# Patient Record
Sex: Female | Born: 1980 | Race: White | Hispanic: No | Marital: Single | Smoking: Current every day smoker
Health system: Southern US, Community
[De-identification: ages and names within clinical notes are randomized; demographics above are authoritative.]

## PROBLEM LIST (undated history)

## (undated) DIAGNOSIS — Z9889 Other specified postprocedural states: Secondary | ICD-10-CM

## (undated) DIAGNOSIS — F17200 Nicotine dependence, unspecified, uncomplicated: Secondary | ICD-10-CM

## (undated) DIAGNOSIS — F99 Mental disorder, not otherwise specified: Secondary | ICD-10-CM

## (undated) DIAGNOSIS — N760 Acute vaginitis: Secondary | ICD-10-CM

## (undated) DIAGNOSIS — Z8619 Personal history of other infectious and parasitic diseases: Secondary | ICD-10-CM

## (undated) DIAGNOSIS — R87619 Unspecified abnormal cytological findings in specimens from cervix uteri: Secondary | ICD-10-CM

## (undated) DIAGNOSIS — A630 Anogenital (venereal) warts: Secondary | ICD-10-CM

## (undated) DIAGNOSIS — T1490XA Injury, unspecified, initial encounter: Secondary | ICD-10-CM

## (undated) DIAGNOSIS — IMO0002 Reserved for concepts with insufficient information to code with codable children: Secondary | ICD-10-CM

## (undated) DIAGNOSIS — Z789 Other specified health status: Secondary | ICD-10-CM

## (undated) HISTORY — DX: Acute vaginitis: N76.0

## (undated) HISTORY — PX: COLPOSCOPY: SHX161

## (undated) HISTORY — DX: Nicotine dependence, unspecified, uncomplicated: F17.200

## (undated) HISTORY — PX: TUBAL LIGATION: SHX77

## (undated) HISTORY — DX: Other specified postprocedural states: Z98.890

## (undated) HISTORY — DX: Reserved for concepts with insufficient information to code with codable children: IMO0002

## (undated) HISTORY — DX: Mental disorder, not otherwise specified: F99

## (undated) HISTORY — DX: Personal history of other infectious and parasitic diseases: Z86.19

## (undated) HISTORY — DX: Injury, unspecified, initial encounter: T14.90XA

## (undated) HISTORY — PX: WISDOM TOOTH EXTRACTION: SHX21

## (undated) HISTORY — DX: Unspecified abnormal cytological findings in specimens from cervix uteri: R87.619

## (undated) HISTORY — DX: Anogenital (venereal) warts: A63.0

---
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
## 2002-06-24 ENCOUNTER — Emergency Department (HOSPITAL_COMMUNITY): Admission: EM | Admit: 2002-06-24 | Discharge: 2002-06-24 | Payer: Self-pay | Admitting: Emergency Medicine

## 2002-10-11 ENCOUNTER — Other Ambulatory Visit: Admission: RE | Admit: 2002-10-11 | Discharge: 2002-10-11 | Payer: Self-pay | Admitting: Obstetrics and Gynecology

## 2004-03-15 ENCOUNTER — Other Ambulatory Visit: Admission: RE | Admit: 2004-03-15 | Discharge: 2004-03-15 | Payer: Self-pay | Admitting: Obstetrics and Gynecology

## 2004-04-01 DIAGNOSIS — T1490XA Injury, unspecified, initial encounter: Secondary | ICD-10-CM

## 2004-04-01 HISTORY — DX: Injury, unspecified, initial encounter: T14.90XA

## 2004-05-18 ENCOUNTER — Other Ambulatory Visit: Admission: RE | Admit: 2004-05-18 | Discharge: 2004-05-18 | Payer: Self-pay | Admitting: Obstetrics and Gynecology

## 2004-10-16 ENCOUNTER — Other Ambulatory Visit: Admission: RE | Admit: 2004-10-16 | Discharge: 2004-10-16 | Payer: Self-pay | Admitting: Obstetrics and Gynecology

## 2005-03-15 ENCOUNTER — Other Ambulatory Visit: Admission: RE | Admit: 2005-03-15 | Discharge: 2005-03-15 | Payer: Self-pay | Admitting: Obstetrics and Gynecology

## 2005-04-01 HISTORY — PX: SPLENECTOMY, PARTIAL: SHX787

## 2005-08-09 ENCOUNTER — Inpatient Hospital Stay (HOSPITAL_COMMUNITY): Admission: EM | Admit: 2005-08-09 | Discharge: 2005-08-14 | Payer: Self-pay | Admitting: Emergency Medicine

## 2005-08-25 ENCOUNTER — Emergency Department (HOSPITAL_COMMUNITY): Admission: EM | Admit: 2005-08-25 | Discharge: 2005-08-25 | Payer: Self-pay | Admitting: Emergency Medicine

## 2005-10-27 ENCOUNTER — Emergency Department (HOSPITAL_COMMUNITY): Admission: EM | Admit: 2005-10-27 | Discharge: 2005-10-27 | Payer: Self-pay | Admitting: Emergency Medicine

## 2005-10-29 ENCOUNTER — Emergency Department (HOSPITAL_COMMUNITY): Admission: EM | Admit: 2005-10-29 | Discharge: 2005-10-29 | Payer: Self-pay | Admitting: Emergency Medicine

## 2006-10-09 ENCOUNTER — Emergency Department (HOSPITAL_COMMUNITY): Admission: EM | Admit: 2006-10-09 | Discharge: 2006-10-09 | Payer: Self-pay | Admitting: Emergency Medicine

## 2008-07-10 ENCOUNTER — Inpatient Hospital Stay (HOSPITAL_COMMUNITY): Admission: AD | Admit: 2008-07-10 | Discharge: 2008-07-12 | Payer: Self-pay | Admitting: Obstetrics and Gynecology

## 2010-03-31 ENCOUNTER — Inpatient Hospital Stay (HOSPITAL_COMMUNITY): Admission: AD | Admit: 2010-03-31 | Payer: Self-pay | Admitting: Obstetrics and Gynecology

## 2010-06-11 ENCOUNTER — Inpatient Hospital Stay (HOSPITAL_COMMUNITY)
Admission: AD | Admit: 2010-06-11 | Discharge: 2010-06-13 | DRG: 774 | Disposition: A | Discharge status: Home / Self Care | Payer: Medicaid Other | Source: Ambulatory Visit | Attending: Obstetrics and Gynecology | Admitting: Obstetrics and Gynecology

## 2010-06-11 ENCOUNTER — Other Ambulatory Visit: Payer: Self-pay | Admitting: Obstetrics and Gynecology

## 2010-06-11 DIAGNOSIS — O9903 Anemia complicating the puerperium: Secondary | ICD-10-CM | POA: Diagnosis not present

## 2010-06-11 DIAGNOSIS — D649 Anemia, unspecified: Secondary | ICD-10-CM | POA: Diagnosis not present

## 2010-06-11 DIAGNOSIS — O4590 Premature separation of placenta, unspecified, unspecified trimester: Principal | ICD-10-CM | POA: Diagnosis present

## 2010-06-11 LAB — CBC
MCHC: 33.2 g/dL (ref 30.0–36.0)
MCV: 92 fL (ref 78.0–100.0)
RBC: 3.63 MIL/uL — ABNORMAL LOW (ref 3.87–5.11)
RDW: 14.4 % (ref 11.5–15.5)
WBC: 18.4 10*3/uL — ABNORMAL HIGH (ref 4.0–10.5)

## 2010-06-12 LAB — CBC
Hemoglobin: 9.1 g/dL — ABNORMAL LOW (ref 12.0–15.0)
MCHC: 33.3 g/dL (ref 30.0–36.0)
RDW: 14.4 % (ref 11.5–15.5)

## 2010-06-13 LAB — CBC
HCT: 27.7 % — ABNORMAL LOW (ref 36.0–46.0)
Hemoglobin: 8.9 g/dL — ABNORMAL LOW (ref 12.0–15.0)
MCHC: 32.1 g/dL (ref 30.0–36.0)
MCV: 93.9 fL (ref 78.0–100.0)
RDW: 14.7 % (ref 11.5–15.5)
WBC: 13.2 10*3/uL — ABNORMAL HIGH (ref 4.0–10.5)

## 2010-06-25 ENCOUNTER — Inpatient Hospital Stay (HOSPITAL_COMMUNITY): Admission: RE | Admit: 2010-06-25 | Payer: Self-pay | Source: Ambulatory Visit | Admitting: Obstetrics and Gynecology

## 2010-06-26 ENCOUNTER — Other Ambulatory Visit: Payer: Self-pay | Admitting: Obstetrics and Gynecology

## 2010-06-26 ENCOUNTER — Ambulatory Visit (HOSPITAL_COMMUNITY)
Admission: RE | Admit: 2010-06-26 | Discharge: 2010-06-26 | Disposition: A | Discharge status: Home / Self Care | Payer: Medicaid Other | Source: Ambulatory Visit | Attending: Obstetrics and Gynecology | Admitting: Obstetrics and Gynecology

## 2010-06-26 ENCOUNTER — Ambulatory Visit (HOSPITAL_COMMUNITY): Payer: Medicaid Other

## 2010-06-26 DIAGNOSIS — IMO0002 Reserved for concepts with insufficient information to code with codable children: Secondary | ICD-10-CM

## 2010-06-26 LAB — CBC
HCT: 36.1 % (ref 36.0–46.0)
Hemoglobin: 11.7 g/dL — ABNORMAL LOW (ref 12.0–15.0)
MCV: 92.1 fL (ref 78.0–100.0)
RBC: 3.92 MIL/uL (ref 3.87–5.11)
RDW: 14 % (ref 11.5–15.5)

## 2010-07-11 LAB — CBC
HCT: 31.5 % — ABNORMAL LOW (ref 36.0–46.0)
HCT: 36.3 % (ref 36.0–46.0)
Hemoglobin: 10.5 g/dL — ABNORMAL LOW (ref 12.0–15.0)
Hemoglobin: 12.2 g/dL (ref 12.0–15.0)
MCHC: 33.3 g/dL (ref 30.0–36.0)
MCV: 94.3 fL (ref 78.0–100.0)
RBC: 3.34 MIL/uL — ABNORMAL LOW (ref 3.87–5.11)
RBC: 3.88 MIL/uL (ref 3.87–5.11)
RDW: 14.6 % (ref 11.5–15.5)
WBC: 17.5 10*3/uL — ABNORMAL HIGH (ref 4.0–10.5)
WBC: 18.8 10*3/uL — ABNORMAL HIGH (ref 4.0–10.5)

## 2010-07-11 LAB — CCBB MATERNAL DONOR DRAW

## 2010-07-17 NOTE — Op Note (Signed)
  NAME:  [name], [name]                 ACCOUNT NO.:  [id number]  MEDICAL RECORD NO.:  [id number]           PATIENT TYPE:  O  LOCATION:  [facility]                          [facility]  PHYSICIAN:  Kaito Schulenburg A. Nimrat Woolworth, M.D. DATE OF BIRTH:  26-Feb-1981  DATE OF PROCEDURE: DATE OF DISCHARGE:                              OPERATIVE REPORT   PREOPERATIVE DIAGNOSIS:  Retained products of conception, 2 weeks status post vaginal delivery.  POSTOPERATIVE DIAGNOSIS:  Retained products of conception, 2 weeks status post vaginal delivery.  PROCEDURE:  D and C.  SURGEON:  Durward Matranga A. Araceli Coufal, MD  There are no assistants.  ANESTHESIA:  General and local.  FINDINGS:  A 12-week size uterus for blood.  SPECIMENS:  Products of conception sent to Pathology.  ESTIMATED BLOOD LOSS:  About 200 mL.  COMPLICATIONS:  None.  The patient went to PACU in stable condition.  PROCEDURE IN DETAIL:  The patient was taken to the operating room where she was given anesthesia, placed in dorsal lithotomy position, prepped and draped in normal sterile fashion.  Uterus was noted to be 12-week size and no adnexal masses.  The weighted speculum was placed into the posterior portion of the vagina.  A Deaver was placed into the anterior portion of the vagina.  The anterior cervix was grasped with single- tooth tenaculum, 18 mL of 2% lidocaine was used for cervical block. Under ultrasound guidance, a size 10 suction curettage placed into the cavity.  There were no perforations and this large amount of blood were obtained through the suction.  There may have been some products and then a curette was placed into the uterus and all four walls were curetted without difficulty.  Suction curettage was then replaced and the remaining amount of blood was removed.  All instruments were removed from the vagina.  Tenaculum site was noted to be hemostatic. Sponge, lap, and needle counts were correct.  The ultrasonographer took a  before and after picture and there were no signs of retained products at the end of the procedure.     Deen Deguia A. Normand Sloop, M.D.     NAD/MEDQ  D:  06/26/2010  T:  06/27/2010  Job:  [id number]  Electronically Signed by Jaymes Graff M.D. on 07/17/2010 12:49:11 PM
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
## 2010-08-17 NOTE — H&P (Signed)
[facility]  PatientANNIEBELL, [name] Visit Number: [id number] MRN: [id number]          Service Type: OBS Location: 910B [id number] Attending Physician:  Jaymes Graff A Dictated by:   Pierre Bali Normand Sloop, M.D. Adm. Date:  11/23/2000   CC:         Mack Guise, C.N.M.   History and Physical  HISTORY OF PRESENT ILLNESS:   Patient is an 30 year old G2 P0-0-1-0 with estimated due date of December 01, 2000 at 27 and six-sevenths weeks today who presented to the MAU complaining of contractions increasing in intensity and frequency.  Good fetal movement, no leakage of fluid.  She denied any headache or blurred vision or right upper quadrant pain.  Denied any prodromal or HSV outbreak now or any time in this pregnancy.  Prenatal care is at [facility] with nurse midwife.  Complications are: 1. Questionable history of HSV.  Patient reports that she had cold sores but    the blood showed that she may have HSV 1 or 2. 2. Teen pregnancy. 3. Patient is a smoker. 4. Late prenatal care.  PRENATAL LABORATORY DATA:     O positive, antibody negative.  RPR was nonreactive.  Rubella immune.  Hepatitis B surface antigen negative.  HIV was nonreactive.  GC and chlamydia cultures both negative.  Pap did show some yeast.  One-hour Glucola was 82.  Patient was GBS negative.  PAST OBSTETRICAL HISTORY:     Significant for elective abortion in October 2001 in the first trimester.  PAST GYNECOLOGICAL HISTORY:    Menarche at age 49, occurring every 83 to 30 days, lasting for five to seven days.  A questionable history of HSV but no history of any outbreaks or abnormal Paps.  PAST MEDICAL AND SURGICAL HISTORY:             Unremarkable.  MEDICATIONS:                  Prenatal vitamins.  ALLERGIES:                    Patient has no known drug allergies.  SOCIAL HISTORY:               She is a smoker, a half a pack to a pack a day.  PHYSICAL EXAMINATION:  FETAL MONITOR  DATA:           Fetal heart tones are 135-145, reactive, with good long-term variability.  Toco is contracting every two to six minutes.  GENERAL:                      Patient is in mild distress secondary to pain.  HEART:                        Regular.  LUNGS:                        Clear.  ABDOMEN:                      Gravid, soft, nontender.  PELVIC:                       On sterile speculum exam there were HSV lesions seen on cervix or in the vagina or on vulva.  Cervix was 5, 100, 0.  EXTREMITIES:  With 2+ deep tendon reflexes bilaterally with trace edema bilaterally and no clonus bilaterally.  IMPRESSION:                   Patient is pregnant at term.  PLAN:                         She is admitted to labor and delivery, given Stadol and Phenergan for pain.  Patient does not want an epidural at this point.  PIH labs were sent and monitor blood pressures.  Anticipate vaginal delivery.  CNM will be coming in for delivery. Dictated by:   Pierre Bali. Normand Sloop, M.D. Attending Physician:  Michael Litter DD:  11/23/00 TD:  11/23/00 Job: [id number]
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
## 2010-11-28 ENCOUNTER — Other Ambulatory Visit: Payer: Self-pay | Admitting: Obstetrics and Gynecology

## 2010-12-06 ENCOUNTER — Encounter (HOSPITAL_COMMUNITY): Payer: Self-pay | Admitting: *Deleted

## 2010-12-18 ENCOUNTER — Encounter (HOSPITAL_COMMUNITY): Payer: Self-pay | Admitting: Anesthesiology

## 2010-12-18 ENCOUNTER — Encounter (HOSPITAL_COMMUNITY): Payer: Self-pay | Admitting: *Deleted

## 2010-12-18 ENCOUNTER — Ambulatory Visit (HOSPITAL_COMMUNITY)
Admission: RE | Admit: 2010-12-18 | Discharge: 2010-12-18 | Disposition: A | Discharge status: Home / Self Care | Payer: Medicaid Other | Source: Ambulatory Visit | Attending: Obstetrics and Gynecology | Admitting: Obstetrics and Gynecology

## 2010-12-18 ENCOUNTER — Encounter (HOSPITAL_COMMUNITY)
Admission: RE | Disposition: A | Discharge status: Home / Self Care | Payer: Self-pay | Source: Ambulatory Visit | Attending: Obstetrics and Gynecology

## 2010-12-18 ENCOUNTER — Ambulatory Visit (HOSPITAL_COMMUNITY): Payer: Medicaid Other | Admitting: Anesthesiology

## 2010-12-18 DIAGNOSIS — Z9889 Other specified postprocedural states: Secondary | ICD-10-CM

## 2010-12-18 DIAGNOSIS — Z302 Encounter for sterilization: Secondary | ICD-10-CM | POA: Insufficient documentation

## 2010-12-18 HISTORY — PX: LAPAROSCOPIC TUBAL LIGATION: SHX1937

## 2010-12-18 HISTORY — DX: Other specified health status: Z78.9

## 2010-12-18 LAB — SURGICAL PCR SCREEN: Staphylococcus aureus: NEGATIVE

## 2010-12-18 LAB — CBC
HCT: 40.2 % (ref 36.0–46.0)
Hemoglobin: 13.4 g/dL (ref 12.0–15.0)
MCHC: 33.3 g/dL (ref 30.0–36.0)
MCV: 89.5 fL (ref 78.0–100.0)
RDW: 17.2 % — ABNORMAL HIGH (ref 11.5–15.5)

## 2010-12-18 SURGERY — LIGATION, FALLOPIAN TUBE, LAPAROSCOPIC
Anesthesia: General | Site: Abdomen | Laterality: Bilateral | Wound class: Clean

## 2010-12-18 MED ORDER — BUPIVACAINE HCL (PF) 0.25 % IJ SOLN
INTRAMUSCULAR | Status: DC | PRN
  Administered 2010-12-18: 15 mL

## 2010-12-18 MED ORDER — LIDOCAINE HCL (CARDIAC) 20 MG/ML IV SOLN
INTRAVENOUS | Status: AC
  Filled 2010-12-18: qty 5

## 2010-12-18 MED ORDER — FENTANYL CITRATE 0.05 MG/ML IJ SOLN
INTRAMUSCULAR | Status: AC
  Filled 2010-12-18: qty 5

## 2010-12-18 MED ORDER — MEPERIDINE HCL 25 MG/ML IJ SOLN: 6.2500 mg | INTRAMUSCULAR | Status: DC | PRN

## 2010-12-18 MED ORDER — DEXAMETHASONE SODIUM PHOSPHATE 10 MG/ML IJ SOLN
INTRAMUSCULAR | Status: AC
  Filled 2010-12-18: qty 1

## 2010-12-18 MED ORDER — MIDAZOLAM HCL 5 MG/5ML IJ SOLN
INTRAMUSCULAR | Status: DC | PRN
  Administered 2010-12-18: 2 mg via INTRAVENOUS

## 2010-12-18 MED ORDER — PROPOFOL 10 MG/ML IV EMUL
INTRAVENOUS | Status: DC | PRN
  Administered 2010-12-18: 200 mg via INTRAVENOUS

## 2010-12-18 MED ORDER — GLYCOPYRROLATE 0.2 MG/ML IJ SOLN
INTRAMUSCULAR | Status: DC | PRN
  Administered 2010-12-18: 0.2 mg via INTRAVENOUS

## 2010-12-18 MED ORDER — SILVER NITRATE-POT NITRATE 75-25 % EX MISC
CUTANEOUS | Status: DC | PRN
  Administered 2010-12-18: 1

## 2010-12-18 MED ORDER — MUPIROCIN 2 % EX OINT
TOPICAL_OINTMENT | CUTANEOUS | Status: AC
  Filled 2010-12-18: qty 22

## 2010-12-18 MED ORDER — SUCCINYLCHOLINE CHLORIDE 20 MG/ML IJ SOLN
INTRAMUSCULAR | Status: DC | PRN
  Administered 2010-12-18: 60 mg via INTRAVENOUS

## 2010-12-18 MED ORDER — ROCURONIUM BROMIDE 100 MG/10ML IV SOLN
INTRAVENOUS | Status: DC | PRN
  Administered 2010-12-18: 10 mg via INTRAVENOUS

## 2010-12-18 MED ORDER — PROPOFOL 10 MG/ML IV EMUL
INTRAVENOUS | Status: AC
  Filled 2010-12-18: qty 20

## 2010-12-18 MED ORDER — HYDROCODONE-ACETAMINOPHEN 5-325 MG PO TABS
ORAL_TABLET | ORAL | Status: AC
  Filled 2010-12-18: qty 1

## 2010-12-18 MED ORDER — LIDOCAINE HCL (CARDIAC) 20 MG/ML IV SOLN
INTRAVENOUS | Status: DC | PRN
  Administered 2010-12-18: 100 mg via INTRAVENOUS

## 2010-12-18 MED ORDER — ONDANSETRON HCL 4 MG/2ML IJ SOLN
INTRAMUSCULAR | Status: AC
  Filled 2010-12-18: qty 2

## 2010-12-18 MED ORDER — KETOROLAC TROMETHAMINE 30 MG/ML IJ SOLN
INTRAMUSCULAR | Status: AC
  Administered 2010-12-18: 30 mg via INTRAVENOUS
  Filled 2010-12-18: qty 1

## 2010-12-18 MED ORDER — LACTATED RINGERS IV SOLN
INTRAVENOUS | Status: DC
  Administered 2010-12-18: 10:00:00 via INTRAVENOUS

## 2010-12-18 MED ORDER — HYDROCODONE-ACETAMINOPHEN 5-325 MG PO TABS
1.0000 | ORAL_TABLET | Freq: Once | ORAL | Status: AC
  Administered 2010-12-18: 1 via ORAL

## 2010-12-18 MED ORDER — HYDROMORPHONE HCL 1 MG/ML IJ SOLN
INTRAMUSCULAR | Status: AC
  Filled 2010-12-18: qty 1

## 2010-12-18 MED ORDER — FENTANYL CITRATE 0.05 MG/ML IJ SOLN
INTRAMUSCULAR | Status: AC
  Administered 2010-12-18: 50 ug via INTRAVENOUS
  Filled 2010-12-18: qty 2

## 2010-12-18 MED ORDER — NEOSTIGMINE METHYLSULFATE 1 MG/ML IJ SOLN
INTRAMUSCULAR | Status: AC
  Filled 2010-12-18: qty 10

## 2010-12-18 MED ORDER — MUPIROCIN 2 % EX OINT
TOPICAL_OINTMENT | Freq: Two times a day (BID) | CUTANEOUS | Status: DC
  Administered 2010-12-18: 10:00:00 via NASAL

## 2010-12-18 MED ORDER — GLYCOPYRROLATE 0.2 MG/ML IJ SOLN
INTRAMUSCULAR | Status: AC
  Filled 2010-12-18: qty 2

## 2010-12-18 MED ORDER — ROCURONIUM BROMIDE 50 MG/5ML IV SOLN
INTRAVENOUS | Status: AC
  Filled 2010-12-18: qty 1

## 2010-12-18 MED ORDER — ONDANSETRON HCL 4 MG/2ML IJ SOLN
INTRAMUSCULAR | Status: DC | PRN
  Administered 2010-12-18: 4 mg via INTRAVENOUS

## 2010-12-18 MED ORDER — ONDANSETRON HCL 4 MG/2ML IJ SOLN
4.0000 mg | Freq: Once | INTRAMUSCULAR | Status: DC | PRN
Start: 2010-12-18 — End: 2010-12-18

## 2010-12-18 MED ORDER — SUCCINYLCHOLINE CHLORIDE 20 MG/ML IJ SOLN
INTRAMUSCULAR | Status: AC
  Filled 2010-12-18: qty 1

## 2010-12-18 MED ORDER — FENTANYL CITRATE 0.05 MG/ML IJ SOLN
INTRAMUSCULAR | Status: AC
  Filled 2010-12-18: qty 2

## 2010-12-18 MED ORDER — HYDROMORPHONE HCL 1 MG/ML IJ SOLN
INTRAMUSCULAR | Status: DC | PRN
  Administered 2010-12-18: 1 mg via INTRAVENOUS

## 2010-12-18 MED ORDER — IBUPROFEN 200 MG PO TABS: 200.0000 mg | ORAL_TABLET | Freq: Four times a day (QID) | ORAL | Status: AC | PRN

## 2010-12-18 MED ORDER — KETOROLAC TROMETHAMINE 30 MG/ML IJ SOLN: 30.0000 mg | Freq: Four times a day (QID) | INTRAMUSCULAR | Status: DC | PRN

## 2010-12-18 MED ORDER — HYDROCODONE-ACETAMINOPHEN 5-500 MG PO TABS: 1.0000 | ORAL_TABLET | Freq: Four times a day (QID) | ORAL | Status: AC | PRN

## 2010-12-18 MED ORDER — KETOROLAC TROMETHAMINE 30 MG/ML IJ SOLN
INTRAMUSCULAR | Status: AC
  Administered 2010-12-18: 30 mg via INTRAMUSCULAR
  Filled 2010-12-18: qty 1

## 2010-12-18 MED ORDER — KETOROLAC TROMETHAMINE 30 MG/ML IJ SOLN
15.0000 mg | Freq: Once | INTRAMUSCULAR | Status: AC | PRN
  Administered 2010-12-18: 30 mg via INTRAVENOUS

## 2010-12-18 MED ORDER — FENTANYL CITRATE 0.05 MG/ML IJ SOLN
INTRAMUSCULAR | Status: DC | PRN
  Administered 2010-12-18: 100 ug via INTRAVENOUS
  Administered 2010-12-18: 150 ug via INTRAVENOUS

## 2010-12-18 MED ORDER — NEOSTIGMINE METHYLSULFATE 1 MG/ML IJ SOLN
INTRAMUSCULAR | Status: DC | PRN
  Administered 2010-12-18: 1 mg via INTRAMUSCULAR

## 2010-12-18 MED ORDER — CEFAZOLIN SODIUM 1-5 GM-% IV SOLN
INTRAVENOUS | Status: AC
  Administered 2010-12-18: 1 g via INTRAVENOUS
  Filled 2010-12-18: qty 50

## 2010-12-18 MED ORDER — DEXAMETHASONE SODIUM PHOSPHATE 10 MG/ML IJ SOLN
INTRAMUSCULAR | Status: DC | PRN
  Administered 2010-12-18: 10 mg via INTRAVENOUS

## 2010-12-18 MED ORDER — CEFAZOLIN SODIUM 1-5 GM-% IV SOLN: 1.0000 g | INTRAVENOUS | Status: DC

## 2010-12-18 MED ORDER — MIDAZOLAM HCL 2 MG/2ML IJ SOLN
INTRAMUSCULAR | Status: AC
  Filled 2010-12-18: qty 2

## 2010-12-18 MED ORDER — GLYCOPYRROLATE 0.2 MG/ML IJ SOLN
INTRAMUSCULAR | Status: AC
  Filled 2010-12-18: qty 1

## 2010-12-18 MED ORDER — FENTANYL CITRATE 0.05 MG/ML IJ SOLN
25.0000 ug | INTRAMUSCULAR | Status: DC | PRN
  Administered 2010-12-18 (×3): 50 ug via INTRAVENOUS

## 2010-12-18 SURGICAL SUPPLY — 15 items
CATH ROBINSON RED A/P 16FR (CATHETERS) ×2 IMPLANT
CLIP FILSHIE TUBAL LIGA STRL (Clip) ×2 IMPLANT
CLOTH BEACON ORANGE TIMEOUT ST (SAFETY) ×2 IMPLANT
DRSG COVADERM PLUS 2X2 (GAUZE/BANDAGES/DRESSINGS) ×1 IMPLANT
GLOVE BIO SURGEON STRL SZ 6.5 (GLOVE) ×4 IMPLANT
GLOVE BIOGEL PI IND STRL 7.0 (GLOVE) ×1 IMPLANT
GLOVE BIOGEL PI INDICATOR 7.0 (GLOVE) ×1
GOWN PREVENTION PLUS LG XLONG (DISPOSABLE) ×4 IMPLANT
PACK LAPAROSCOPY BASIN (CUSTOM PROCEDURE TRAY) ×2 IMPLANT
SUT MNCRL AB 3-0 PS2 27 (SUTURE) ×2 IMPLANT
SUT VICRYL 0 UR6 27IN ABS (SUTURE) ×2 IMPLANT
TOWEL OR 17X24 6PK STRL BLUE (TOWEL DISPOSABLE) ×4 IMPLANT
TROCAR BALLN 12MMX100 BLUNT (TROCAR) ×2 IMPLANT
TROCAR Z-THREAD FIOS 5X100MM (TROCAR) ×1 IMPLANT
WATER STERILE IRR 1000ML POUR (IV SOLUTION) ×2 IMPLANT

## 2010-12-18 NOTE — H&P (Signed)
NAME:  [name], [name]                 ACCOUNT NO.:  [id number]  MEDICAL RECORD NO.:  [id number]  LOCATION:  [facility]                          [facility]  PHYSICIAN:  Jadon Harbaugh A. Brittiany Wiehe, M.D. DATE OF BIRTH:  05-Apr-1980  DATE OF ADMISSION:  12/18/2010 DATE OF DISCHARGE:                             HISTORY & PHYSICAL   HISTORY OF PRESENT ILLNESS:  The patient is a 30 year old, gravida 4, para 3 who presented to my office and she wanted a tubal ligation.  She has tried NuvaRing and other birth control in the past and wants permanent sterilization.  PAST MEDICAL HISTORY:  Unremarkable.  PAST SURGICAL HISTORY:  Spleen repaired, mouth implants, and a D and C.  MEDICATIONS:  None.  SOCIAL HISTORY:  She smokes 2 packs a day and has occasional alcohol use.  No illicit drug use.  FAMILY HISTORY:  No history of breast or ovarian cancer.  REVIEW OF SYSTEMS:  HEMATOLOGIC:  No bleeding disorders. MUSCULOSKELETAL:  No weakness.  GENITOURINARY:  Significant for history of CIN-1.  Her last Pap smear was normal.  CARDIOVASCULAR:  No heart palpitations.  ALLERGIES:  The patient has no known drug allergies.  PHYSICAL EXAMINATION:  VITAL SIGNS:  The patient's weight is 149 pounds, her blood pressure is 100/70, and she is 5 feet 5 inches. HEENT:  Her pupils are equal.  Her hearing is normal.  Throat is clear. NECK:  Thyroid is not enlarged. HEART:  Regular rate and rhythm. LUNGS:  Clear to auscultation bilaterally. BACK:  No CVA tenderness bilaterally. ABDOMEN:  Nontender without any masses or organomegaly. EXTREMITIES;  She has no cyanosis, clubbing, or edema. NEUROLOGIC:  Within normal limits. GU:  Vulva is normal.  Vagina is normal.  Cervix is slightly friable without any lesions.  Uterus is normal shape, size, and consistency. Adnexa has no masses.  Rectovaginal, no masses.  ASSESSMENT:  Multiparity, desires sterilization.  PLAN:  Laparoscopic tubal ligation.  The patient understands the  risks are not limited to bleeding, infection, damage to internal organs such as bowel, bladder, major blood vessels, and there is a failure rate of about 1 in 200 and half of those still get pregnant.  If they get pregnant, they get pregnant in their tubes.  So, if she gets pregnant, she needs to notify asap.  The patient also reviewed all birth control options.     Habib Kise A. Normand Sloop, M.D.     NAD/MEDQ  D:  12/18/2010  T:  12/18/2010  Job:  [id number]

## 2010-12-18 NOTE — Transfer of Care (Signed)
Immediate Anesthesia Transfer of Care Note  Patient: [name]  Procedure(s) Performed:  LAPAROSCOPIC TUBAL LIGATION - with clips  Patient Location: PACU  Anesthesia Type: General  Level of Consciousness: awake, alert  and oriented  Airway & Oxygen Therapy: Patient Spontanous Breathing and Patient connected to nasal cannula oxygen  Post-op Assessment: Report given to PACU RN and Post -op Vital signs reviewed and stable  Post vital signs: Reviewed and stable  Complications: No apparent anesthesia complications

## 2010-12-18 NOTE — Anesthesia Postprocedure Evaluation (Signed)
Anesthesia Post Note  Patient: [name]  Procedure(s) Performed:  LAPAROSCOPIC TUBAL LIGATION - with clips  Anesthesia type: General  Patient location: PACU  Post pain: Pain level controlled  Post assessment: Post-op Vital signs reviewed  Last Vitals:  Filed Vitals:   12/18/10 1215  BP: 125/79  Pulse: 51  Temp:   Resp: 16    Post vital signs: Reviewed  Level of consciousness: sedated  Complications: No apparent anesthesia complicationsfj

## 2010-12-18 NOTE — Op Note (Signed)
Diagnostic Laparoscopy Procedure Note  Indications: The patient is a 30 y.o. female with diagnosis of multiparity.  Pre-operative Diagnosis: Multiparity desires sterilization  Post-operative Diagnosis: same  Surgeon: AVWUJWJ,XBJYN A   Assistants: none  Anesthesia: General endotracheal anesthesia  ASA Class: 1  Procedure Details  The patient was seen in the Holding Room. The risks, benefits, complications, treatment options, and expected outcomes were discussed with the patient. The possibilities of reaction to medication, pulmonary aspiration, perforation of viscus, bleeding, recurrent infection, the need for additional procedures, failure to diagnose a condition, and creating a complication requiring transfusion or operation were discussed with the patient. The patient concurred with the proposed plan, giving informed consent. The patient was taken to the Operating Room, identified as [name] and the procedure verified as Diagnostic Laparoscopy with BTL. A Time Out was held and the above information confirmed.  After induction of general anesthesia, the patient was placed in modified dorsal lithotomy position where she was prepped, draped, and catheterized in the normal, sterile fashion.  The cervix was visualized and an intrauterine manipulator was placed. A 2 cm umbilical incision was then performed.and carried down to the fascia.  The fascia was then opened and extended bilaterally.  Peritoneum was then entered.  o vicryl was then placed around the fascia in a circumferential fashion.   The hasson was placed and ancored to the suture.The above findings were noted. Normal pelvic anatomy.  Uterus,tubes, ovaries and appendix apperared normal.  The bowel on the right side had some adhesions to the pelvic side wall.  The  Posterior culdesac and liver appeared normal.   Both fallopian tubes were identified and filshie clips were placed on the mid isthmic portion of the tubes.    Following  the procedure the umbilical hasson was removed after intra-abdominal carbon dioxide was expressed. The fascia was reaproximated by tying the 0 vicryl suture.   The incision was closed with subcutaneous and subcuticular sutures of 3-0 monocryl. The intrauterine manipulator was then removed.  There was bleeding from tenaculum site on the cervix.  It could not be relieved with pressure or silvernitrate.  A figure of 8 suture was placed and hemostasis was obtained.    Instrument, sponge, and needle counts were correct prior to abdominal closure and at the conclusion of the case.   Findings: Estimated Blood Loss:  Minimal         Drains: none         Total IV Fluids:         Specimens: none              Complications:  None; patient tolerated the procedure well.         Disposition: PACU - hemodynamically stable.         Condition: stable

## 2010-12-18 NOTE — Anesthesia Preprocedure Evaluation (Addendum)
Anesthesia Evaluation  Name, MR# and DOB Patient awake  General Assessment Comment  Reviewed: Allergy & Precautions, H&P , NPO status , Patient's Chart, lab work & pertinent test results  Airway Mallampati: I TM Distance: >3 FB     Dental  (+) Teeth Intact   Pulmonary    pulmonary exam normalPulmonary Exam Normal     Cardiovascular     Neuro/Psych   GI/Hepatic/Renal negative GI ROS  negative Liver ROS  negative Renal ROS        Endo/Other  Negative Endocrine ROS (+)      Abdominal Normal abdominal exam  (+)   Musculoskeletal negative musculoskeletal ROS (+)   Hematology negative hematology ROS (+)   Peds  Reproductive/Obstetrics negative OB ROS    Anesthesia Other Findings            Anesthesia Physical Anesthesia Plan  ASA: I  Anesthesia Plan: General   Post-op Pain Management:    Induction: Intravenous  Airway Management Planned: Oral ETT  Additional Equipment:   Intra-op Plan:   Post-operative Plan: Extubation in OR  Informed Consent: I have reviewed the patients History and Physical, chart, labs and discussed the procedure including the risks, benefits and alternatives for the proposed anesthesia with the patient or authorized representative who has indicated his/her understanding and acceptance.   Dental advisory given  Plan Discussed with:   Anesthesia Plan Comments:         Anesthesia Quick Evaluation

## 2010-12-18 NOTE — Anesthesia Procedure Notes (Signed)
Procedure Name: Intubation Date/Time: 12/18/2010 10:30 AM Performed by: Karleen Dolphin Pre-anesthesia Checklist: Patient identified, Emergency Drugs available, Suction available, Patient being monitored and Timeout performed Patient Re-evaluated:Patient Re-evaluated prior to inductionOxygen Delivery Method: Circle System Utilized Preoxygenation: Pre-oxygenation with 100% oxygen Intubation Type: IV induction Laryngoscope Size: Mac and 3 Grade View: Grade I Tube type: Oral Tube size: 7.0 mm Number of attempts: 1 Airway Equipment and Method: stylet Placement Confirmation: ETT inserted through vocal cords under direct vision,  positive ETCO2 and breath sounds checked- equal and bilateral Secured at: 21 cm Dental Injury: Teeth and Oropharynx as per pre-operative assessment

## 2010-12-19 ENCOUNTER — Encounter (HOSPITAL_COMMUNITY): Payer: Self-pay | Admitting: Obstetrics and Gynecology

## 2011-01-15 LAB — I-STAT 8, (EC8 V) (CONVERTED LAB)
Acid-Base Excess: 1
Operator id: 282201
Potassium: 4.2
Sodium: 142
TCO2: 28
pH, Ven: 7.377 — ABNORMAL HIGH

## 2011-08-12 ENCOUNTER — Telehealth: Payer: Self-pay | Admitting: Obstetrics and Gynecology

## 2011-08-12 NOTE — Telephone Encounter (Signed)
Spoke with pt rgd msg pt wants rx for yeast infection called into pharm advised pt need an appt for eval in order to get rx pt states on cycle advised pt can try monistat otc pt declined states she cant use tampons if she use that offered pt an appt for eval after cycle pt declined

## 2012-01-07 ENCOUNTER — Ambulatory Visit (INDEPENDENT_AMBULATORY_CARE_PROVIDER_SITE_OTHER): Payer: Medicaid Other

## 2012-01-07 VITALS — BP 110/72 | Ht 65.0 in | Wt 159.0 lb

## 2012-01-07 DIAGNOSIS — Z01419 Encounter for gynecological examination (general) (routine) without abnormal findings: Secondary | ICD-10-CM

## 2012-01-07 DIAGNOSIS — F17200 Nicotine dependence, unspecified, uncomplicated: Secondary | ICD-10-CM

## 2012-01-07 NOTE — Progress Notes (Signed)
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 Clarene Duke Change in ZOX:[id number]  Change in WJX:[id number]

## 2012-01-09 LAB — PAP IG, CT-NG, RFX HPV ASCU
Chlamydia Probe Amp: NEGATIVE
GC Probe Amp: NEGATIVE

## 2012-05-28 ENCOUNTER — Telehealth: Payer: Self-pay

## 2012-05-28 MED ORDER — FLUCONAZOLE 150 MG PO TABS: 150.0000 mg | ORAL_TABLET | Freq: Once | ORAL | Status: DC

## 2012-05-28 NOTE — Telephone Encounter (Signed)
Tc to pt per telephone call. Pt currently taking Azithromycin pres by another physician and states,"has a yeast infection". Pt states,"prescribing physician will not give her anti-fungal meds". Informed pt needs office eval;however pt opts to consult with provider. Consulted with VL, pt may have Diflucan x 1. If no improvement, pt needs office eval. Pt voices understanding. Rx for Diflucan e-pres to pharm on file.

## 2012-06-30 DIAGNOSIS — IMO0002 Reserved for concepts with insufficient information to code with codable children: Secondary | ICD-10-CM | POA: Insufficient documentation

## 2012-06-30 DIAGNOSIS — N760 Acute vaginitis: Secondary | ICD-10-CM | POA: Insufficient documentation

## 2012-06-30 DIAGNOSIS — F17200 Nicotine dependence, unspecified, uncomplicated: Secondary | ICD-10-CM

## 2012-06-30 HISTORY — DX: Nicotine dependence, unspecified, uncomplicated: F17.200
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
## 2012-12-06 ENCOUNTER — Emergency Department (HOSPITAL_BASED_OUTPATIENT_CLINIC_OR_DEPARTMENT_OTHER)
Admission: EM | Admit: 2012-12-06 | Discharge: 2012-12-06 | Disposition: A | Discharge status: Home / Self Care | Payer: Medicaid Other | Attending: Emergency Medicine | Admitting: Emergency Medicine

## 2012-12-06 ENCOUNTER — Encounter (HOSPITAL_BASED_OUTPATIENT_CLINIC_OR_DEPARTMENT_OTHER): Payer: Self-pay

## 2012-12-06 DIAGNOSIS — S1191XA Laceration without foreign body of unspecified part of neck, initial encounter: Secondary | ICD-10-CM

## 2012-12-06 DIAGNOSIS — X58XXXA Exposure to other specified factors, initial encounter: Secondary | ICD-10-CM | POA: Insufficient documentation

## 2012-12-06 DIAGNOSIS — Z23 Encounter for immunization: Secondary | ICD-10-CM | POA: Insufficient documentation

## 2012-12-06 DIAGNOSIS — Z87828 Personal history of other (healed) physical injury and trauma: Secondary | ICD-10-CM | POA: Insufficient documentation

## 2012-12-06 DIAGNOSIS — Z8781 Personal history of (healed) traumatic fracture: Secondary | ICD-10-CM | POA: Insufficient documentation

## 2012-12-06 DIAGNOSIS — F17200 Nicotine dependence, unspecified, uncomplicated: Secondary | ICD-10-CM | POA: Insufficient documentation

## 2012-12-06 DIAGNOSIS — Z8742 Personal history of other diseases of the female genital tract: Secondary | ICD-10-CM | POA: Insufficient documentation

## 2012-12-06 DIAGNOSIS — Y929 Unspecified place or not applicable: Secondary | ICD-10-CM | POA: Insufficient documentation

## 2012-12-06 DIAGNOSIS — Z8619 Personal history of other infectious and parasitic diseases: Secondary | ICD-10-CM | POA: Insufficient documentation

## 2012-12-06 DIAGNOSIS — Y939 Activity, unspecified: Secondary | ICD-10-CM | POA: Insufficient documentation

## 2012-12-06 DIAGNOSIS — Z8659 Personal history of other mental and behavioral disorders: Secondary | ICD-10-CM | POA: Insufficient documentation

## 2012-12-06 DIAGNOSIS — S1190XA Unspecified open wound of unspecified part of neck, initial encounter: Secondary | ICD-10-CM | POA: Insufficient documentation

## 2012-12-06 MED ORDER — TETANUS-DIPHTH-ACELL PERTUSSIS 5-2.5-18.5 LF-MCG/0.5 IM SUSP
0.5000 mL | Freq: Once | INTRAMUSCULAR | Status: AC
  Administered 2012-12-06: 0.5 mL via INTRAMUSCULAR
  Filled 2012-12-06: qty 0.5

## 2012-12-06 NOTE — ED Notes (Signed)
Patient here with laceration to left side of neck. Patient crying on arrival and will not state how laceration occurred. Patient does state if she tells she is afraid she will be taken away
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
## 2013-09-22 ENCOUNTER — Emergency Department (HOSPITAL_BASED_OUTPATIENT_CLINIC_OR_DEPARTMENT_OTHER): Payer: Medicaid Other

## 2013-09-22 ENCOUNTER — Emergency Department (HOSPITAL_BASED_OUTPATIENT_CLINIC_OR_DEPARTMENT_OTHER)
Admission: EM | Admit: 2013-09-22 | Discharge: 2013-09-22 | Disposition: A | Discharge status: Home / Self Care | Payer: Medicaid Other | Attending: Emergency Medicine | Admitting: Emergency Medicine

## 2013-09-22 ENCOUNTER — Encounter (HOSPITAL_BASED_OUTPATIENT_CLINIC_OR_DEPARTMENT_OTHER): Payer: Self-pay | Admitting: Emergency Medicine

## 2013-09-22 DIAGNOSIS — Z9851 Tubal ligation status: Secondary | ICD-10-CM | POA: Insufficient documentation

## 2013-09-22 DIAGNOSIS — R109 Unspecified abdominal pain: Secondary | ICD-10-CM

## 2013-09-22 DIAGNOSIS — M549 Dorsalgia, unspecified: Secondary | ICD-10-CM | POA: Insufficient documentation

## 2013-09-22 DIAGNOSIS — Z8659 Personal history of other mental and behavioral disorders: Secondary | ICD-10-CM | POA: Insufficient documentation

## 2013-09-22 DIAGNOSIS — Z8742 Personal history of other diseases of the female genital tract: Secondary | ICD-10-CM | POA: Insufficient documentation

## 2013-09-22 DIAGNOSIS — Z8619 Personal history of other infectious and parasitic diseases: Secondary | ICD-10-CM | POA: Insufficient documentation

## 2013-09-22 DIAGNOSIS — Z9889 Other specified postprocedural states: Secondary | ICD-10-CM | POA: Insufficient documentation

## 2013-09-22 DIAGNOSIS — F17200 Nicotine dependence, unspecified, uncomplicated: Secondary | ICD-10-CM | POA: Insufficient documentation

## 2013-09-22 DIAGNOSIS — Z3202 Encounter for pregnancy test, result negative: Secondary | ICD-10-CM | POA: Insufficient documentation

## 2013-09-22 DIAGNOSIS — Z87828 Personal history of other (healed) physical injury and trauma: Secondary | ICD-10-CM | POA: Insufficient documentation

## 2013-09-22 DIAGNOSIS — R112 Nausea with vomiting, unspecified: Secondary | ICD-10-CM | POA: Insufficient documentation

## 2013-09-22 LAB — CBC WITH DIFFERENTIAL/PLATELET
Basophils Absolute: 0 10*3/uL (ref 0.0–0.1)
Basophils Relative: 0 % (ref 0–1)
EOS ABS: 0.1 10*3/uL (ref 0.0–0.7)
EOS PCT: 1 % (ref 0–5)
HEMATOCRIT: 42.5 % (ref 36.0–46.0)
Hemoglobin: 14.5 g/dL (ref 12.0–15.0)
LYMPHS ABS: 2.4 10*3/uL (ref 0.7–4.0)
LYMPHS PCT: 22 % (ref 12–46)
MCH: 31.7 pg (ref 26.0–34.0)
MCHC: 34.1 g/dL (ref 30.0–36.0)
MCV: 92.8 fL (ref 78.0–100.0)
MONO ABS: 0.5 10*3/uL (ref 0.1–1.0)
MONOS PCT: 5 % (ref 3–12)
Neutro Abs: 7.7 10*3/uL (ref 1.7–7.7)
Neutrophils Relative %: 72 % (ref 43–77)
PLATELETS: 257 10*3/uL (ref 150–400)
RBC: 4.58 MIL/uL (ref 3.87–5.11)
RDW: 14.6 % (ref 11.5–15.5)
WBC: 10.6 10*3/uL — AB (ref 4.0–10.5)

## 2013-09-22 LAB — URINALYSIS, ROUTINE W REFLEX MICROSCOPIC
BILIRUBIN URINE: NEGATIVE
GLUCOSE, UA: NEGATIVE mg/dL
HGB URINE DIPSTICK: NEGATIVE
KETONES UR: 40 mg/dL — AB
Nitrite: NEGATIVE
PROTEIN: NEGATIVE mg/dL
Specific Gravity, Urine: 1.022 (ref 1.005–1.030)
UROBILINOGEN UA: 0.2 mg/dL (ref 0.0–1.0)
pH: 8.5 — ABNORMAL HIGH (ref 5.0–8.0)

## 2013-09-22 LAB — URINE MICROSCOPIC-ADD ON

## 2013-09-22 LAB — BASIC METABOLIC PANEL
BUN: 11 mg/dL (ref 6–23)
CALCIUM: 10.3 mg/dL (ref 8.4–10.5)
CO2: 20 meq/L (ref 19–32)
CREATININE: 0.8 mg/dL (ref 0.50–1.10)
Chloride: 104 mEq/L (ref 96–112)
GFR calc Af Amer: >90 mL/min (ref >90)
GLUCOSE: 108 mg/dL — AB (ref 70–99)
Potassium: 4.2 mEq/L (ref 3.7–5.3)
Sodium: 139 mEq/L (ref 137–147)

## 2013-09-22 LAB — PREGNANCY, URINE: PREG TEST UR: NEGATIVE

## 2013-09-22 MED ORDER — MORPHINE SULFATE 4 MG/ML IJ SOLN
4.0000 mg | Freq: Once | INTRAMUSCULAR | Status: AC
  Administered 2013-09-22: 4 mg via INTRAVENOUS
  Filled 2013-09-22: qty 1

## 2013-09-22 MED ORDER — HYDROCODONE-ACETAMINOPHEN 5-325 MG PO TABS: 1.0000 | ORAL_TABLET | ORAL | Status: AC | PRN

## 2013-09-22 MED ORDER — SODIUM CHLORIDE 0.9 % IV BOLUS (SEPSIS)
1000.0000 mL | Freq: Once | INTRAVENOUS | Status: AC
  Administered 2013-09-22: 1000 mL via INTRAVENOUS

## 2013-09-22 MED ORDER — ONDANSETRON HCL 4 MG/2ML IJ SOLN
4.0000 mg | Freq: Once | INTRAMUSCULAR | Status: AC
  Administered 2013-09-22: 4 mg via INTRAVENOUS
  Filled 2013-09-22: qty 2

## 2013-09-22 MED ORDER — ONDANSETRON 4 MG PO TBDP: 4.0000 mg | ORAL_TABLET | Freq: Three times a day (TID) | ORAL | Status: AC | PRN

## 2013-09-22 MED ORDER — KETOROLAC TROMETHAMINE 30 MG/ML IJ SOLN
30.0000 mg | Freq: Once | INTRAMUSCULAR | Status: AC
  Administered 2013-09-22: 30 mg via INTRAVENOUS
  Filled 2013-09-22: qty 1

## 2013-09-22 NOTE — ED Notes (Signed)
Pt c/o pain to Left lower abdomen, left low back. Pt also c/o n/v. 3 episodes of vomiting since 8am. Pt sts she had a normal bowel movement today.
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
## 2013-09-22 NOTE — Discharge Instructions (Signed)
Take Vicodin as needed for pain. Take Zofran as needed for nausea. Refer to attached documents for more information. Follow up with your doctor as needed.

## 2013-09-23 NOTE — ED Provider Notes (Signed)
Medical screening examination/treatment/procedure(s) were conducted as a shared visit with non-physician practitioner(s) and myself.  I personally evaluated the patient during the encounter.   EKG Interpretation None      Pt evaluation:  No appreciable tenderness to lt abdomen.  Pt without pelvic pain, or pelvic tenderness. CT without acute process.  Sx markedly relieved with single dose meds.  Pain may have been simple intestinal colic.  + Appetite.  No N/V.  Pt will be DC with precautions to return with recurrence of pain, fever, or other changes. D/W PA Szekalski. I agree with her assessment.  Rolland PorterMark James, MD 09/23/13 [phone]

## 2014-01-31 ENCOUNTER — Encounter (HOSPITAL_BASED_OUTPATIENT_CLINIC_OR_DEPARTMENT_OTHER): Payer: Self-pay | Admitting: Emergency Medicine

## 2014-08-25 IMAGING — CT CT ABD-PELV W/O CM
2 of 4 series · 17 of 46 positions shown, 19 images · non-contrast
Comparison: CT 10/29/2005

CLINICAL DATA: Lower abdominal pain, left back pain. Concern for
renal calculus.

EXAM:
CT ABDOMEN AND PELVIS WITHOUT CONTRAST
TECHNIQUE: Multidetector CT imaging of the abdomen and pelvis was performed
following the standard protocol without IV contrast.

[Series 2: renal stone < 200 lbs 5.0 b31f · axial · 0.73mm/px · z∈[-632,-242]mm · 14 of 86 slices shown, 16 images]
[im 4/86  soft-tissue]
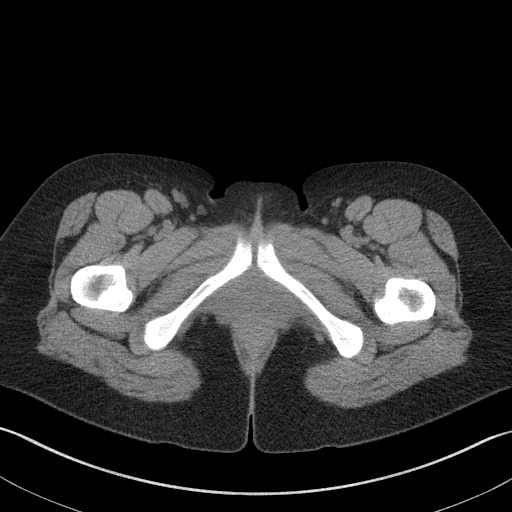
[im 4/86  bone]
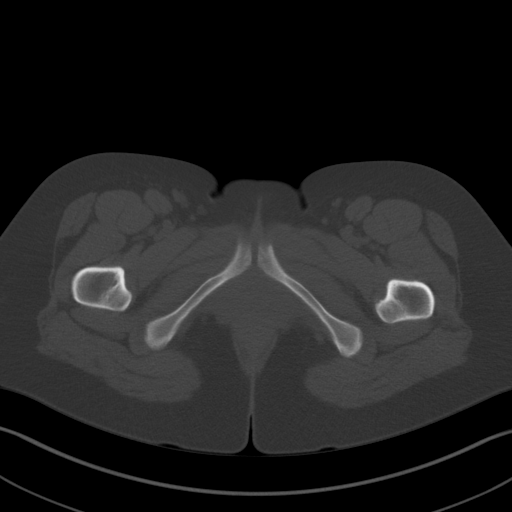
[im 11/86  soft-tissue]
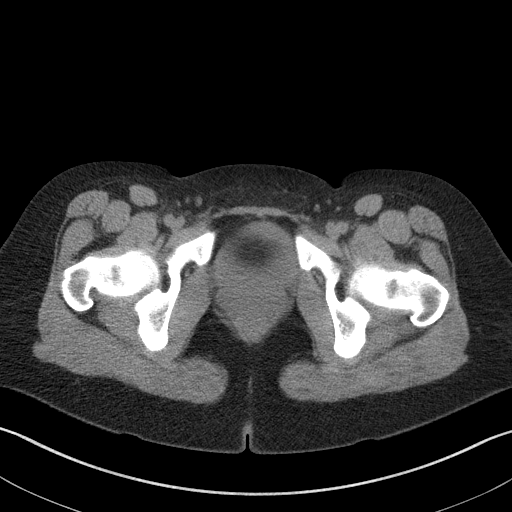
[im 18/86  soft-tissue]
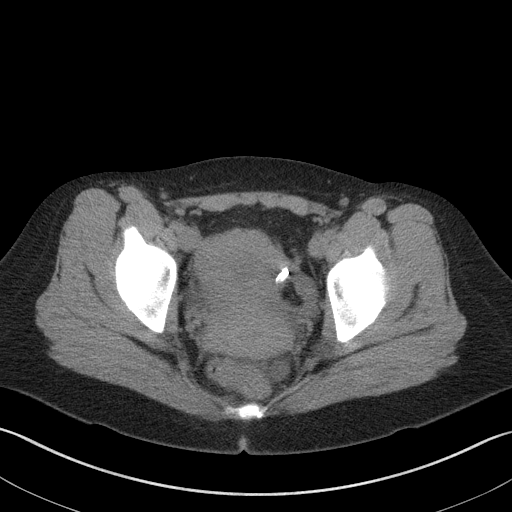
[im 22/86  soft-tissue]
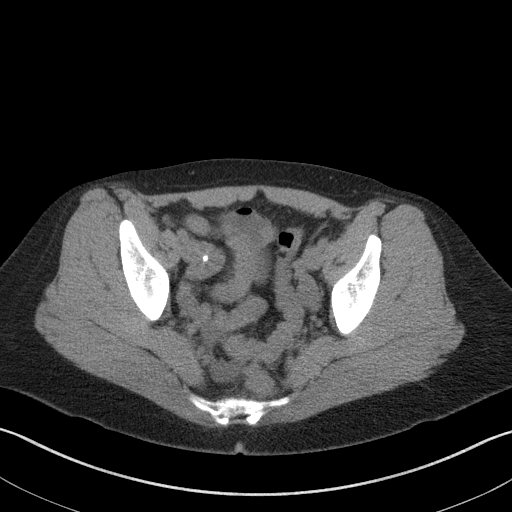
[im 29/86  soft-tissue]
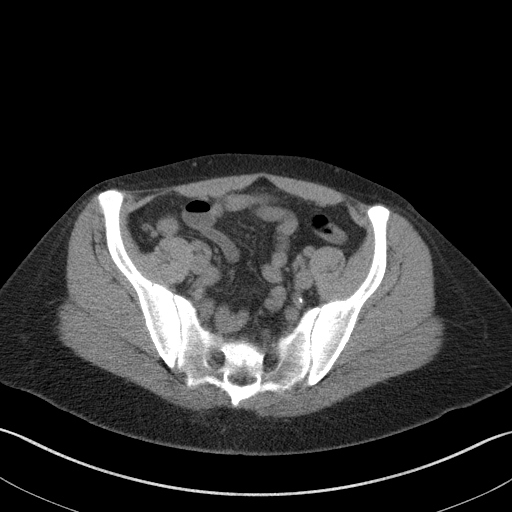
[im 36/86  soft-tissue]
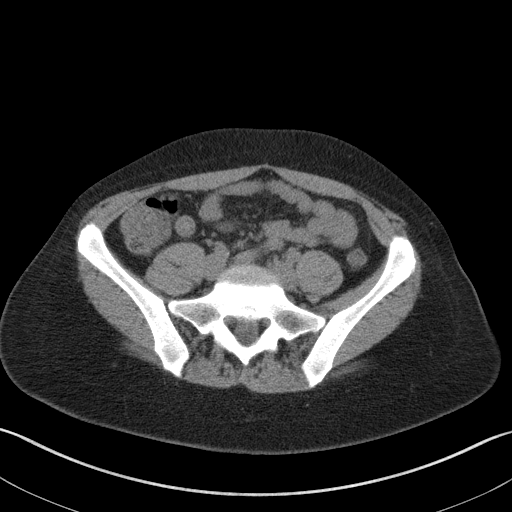
[im 39/86  soft-tissue]
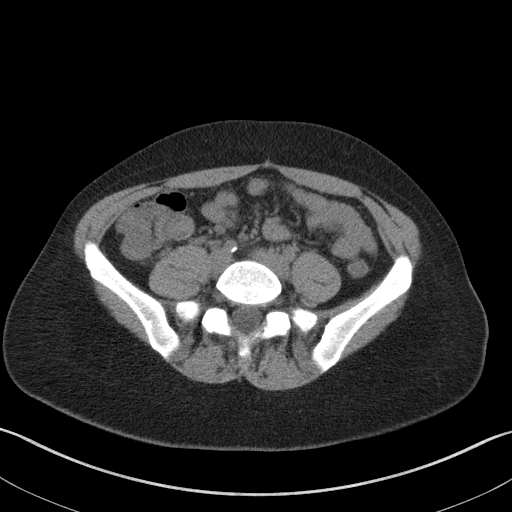
[im 47/86  soft-tissue]
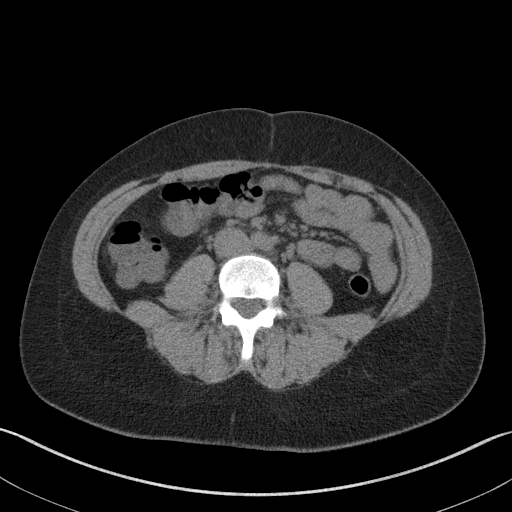
[im 50/86  soft-tissue]
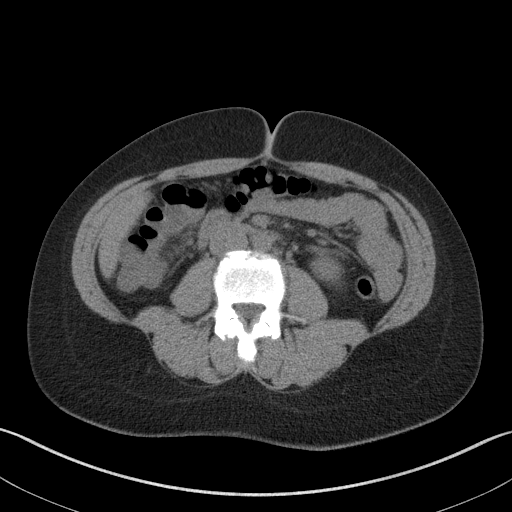
[im 50/86  bone]
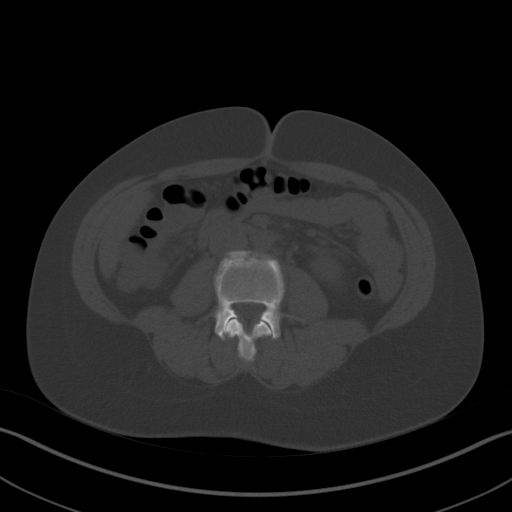
[im 57/86  soft-tissue]
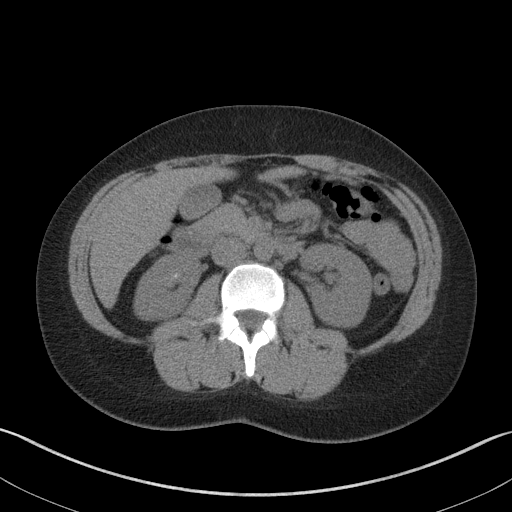
[im 64/86  soft-tissue]
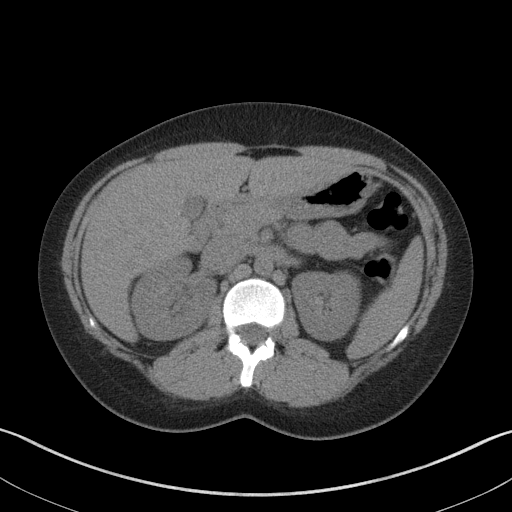
[im 68/86  soft-tissue]
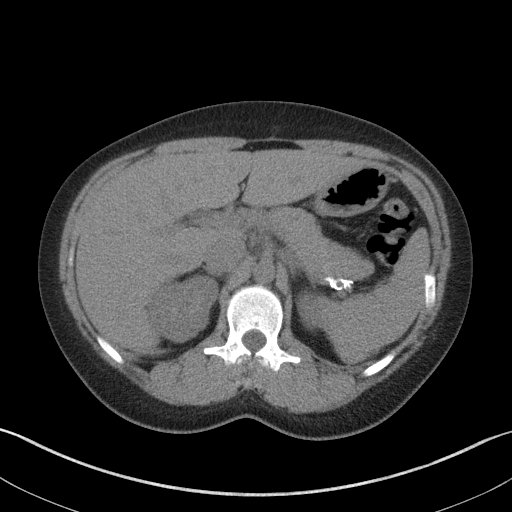
[im 75/86  soft-tissue]
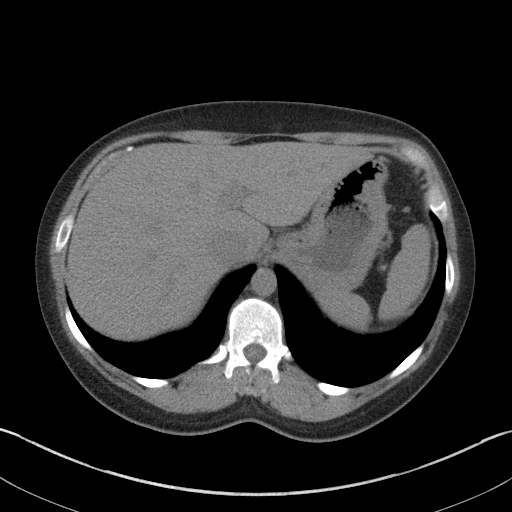
[im 82/86  soft-tissue]
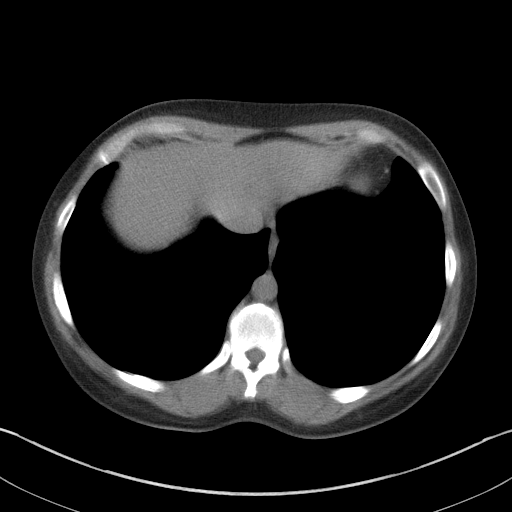

[Series 5: renal stone 3.0 coronal · coronal · 0.73mm/px · 3 of 86 slices shown]
[im 29/86  soft-tissue]
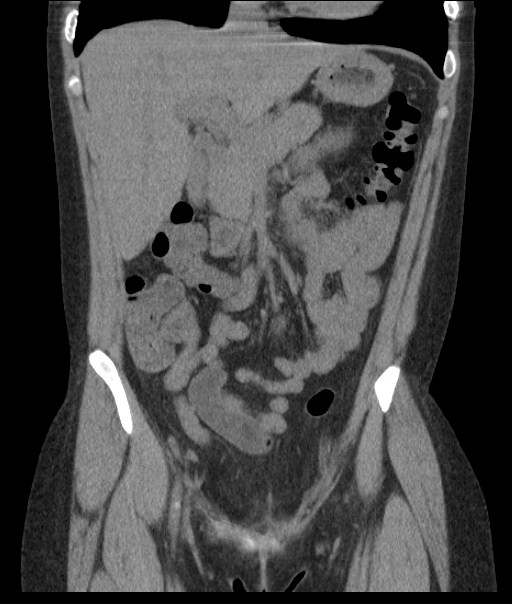
[im 38/86  soft-tissue]
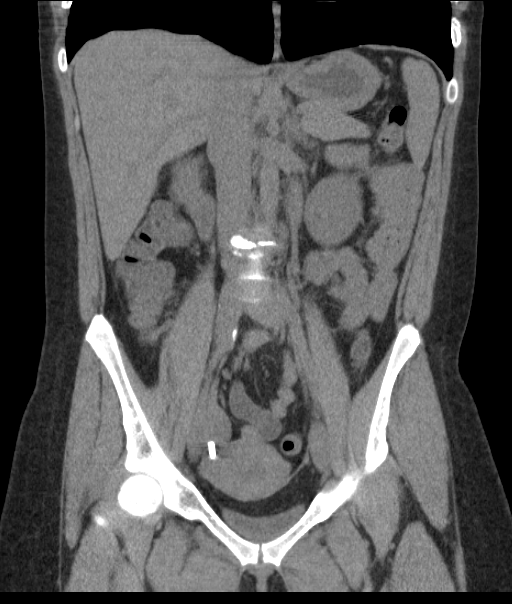
[im 48/86  soft-tissue]
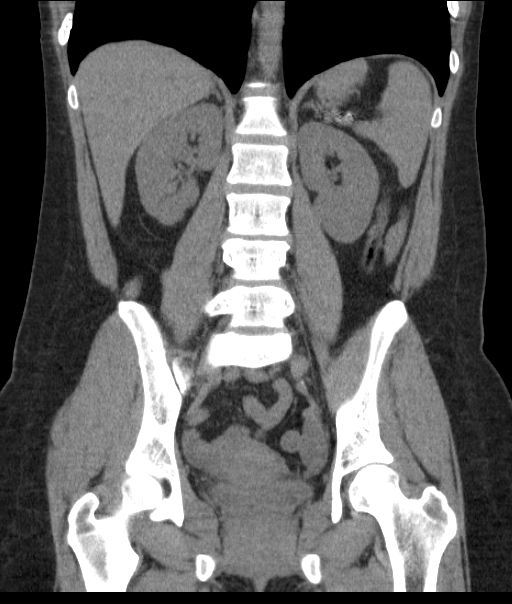

[17 of 46 positions shown; findings below may reference images not displayed]

FINDINGS: Renal: Small calculus in the lower pole of the right kidney measures
3 mm (image 30, series 2). This is nonobstructing. No
ureterolithiasis on the left or right. No obstructive uropathy. No
bladder calculi.

Lung bases are clear. No focal hepatic lesions on non contrast exam.
The gallbladder, pancreas, spleen, adrenal glands are normal.
Vascular coils in the splenic hilum likely representing coiling of a
splenic artery aneurysm.

The stomach, small bowel, appendix, cecum normal. The colon and
rectosigmoid colon are normal.

Abdominal aorta normal caliber. No retroperitoneal or periportal
lymphadenopathy.

No free fluid the pelvis. No distal ureteral stones or bladder
stones. Tubal ligation clips noted. No aggressive osseous lesion.
IMPRESSION: 1. Nonobstructing right renal calculus.
2. No ureterolithiasis or obstructive uropathy.
3. Normal appendix.

## 2023-02-20 ENCOUNTER — Encounter (HOSPITAL_BASED_OUTPATIENT_CLINIC_OR_DEPARTMENT_OTHER): Payer: Self-pay | Admitting: Emergency Medicine

## 2023-02-20 ENCOUNTER — Emergency Department (HOSPITAL_BASED_OUTPATIENT_CLINIC_OR_DEPARTMENT_OTHER)
Admission: EM | Admit: 2023-02-20 | Discharge: 2023-02-20 | Disposition: A | Discharge status: Home / Self Care | Payer: Medicaid Other | Attending: Emergency Medicine | Admitting: Emergency Medicine

## 2023-02-20 ENCOUNTER — Emergency Department (HOSPITAL_BASED_OUTPATIENT_CLINIC_OR_DEPARTMENT_OTHER): Payer: Medicaid Other | Admitting: Radiology

## 2023-02-20 ENCOUNTER — Other Ambulatory Visit: Payer: Self-pay

## 2023-02-20 DIAGNOSIS — S8265XA Nondisplaced fracture of lateral malleolus of left fibula, initial encounter for closed fracture: Secondary | ICD-10-CM | POA: Diagnosis not present

## 2023-02-20 DIAGNOSIS — S8265XB Nondisplaced fracture of lateral malleolus of left fibula, initial encounter for open fracture type I or II: Secondary | ICD-10-CM

## 2023-02-20 DIAGNOSIS — M25572 Pain in left ankle and joints of left foot: Secondary | ICD-10-CM | POA: Diagnosis present

## 2023-02-20 DIAGNOSIS — W08XXXA Fall from other furniture, initial encounter: Secondary | ICD-10-CM | POA: Diagnosis not present

## 2023-02-20 MED ORDER — ACETAMINOPHEN 325 MG PO TABS
650.0000 mg | ORAL_TABLET | Freq: Once | ORAL | Status: AC
  Administered 2023-02-20: 650 mg via ORAL
  Filled 2023-02-20: qty 2

## 2023-02-20 MED ORDER — OXYCODONE HCL 5 MG PO TABS: 5.0000 mg | ORAL_TABLET | Freq: Four times a day (QID) | ORAL | 0 refills | Status: AC | PRN

## 2023-02-20 NOTE — ED Triage Notes (Signed)
Pt reports feeling a "tear" and "hearing something" to her left ankle while attempting to get off of a stool and missing the rung

## 2023-02-20 NOTE — Discharge Instructions (Addendum)
You were seen in the emergency department for your ankle pain.  You do have a nondisplaced fracture on the outside part of your ankle.  This is a stable type of fracture that you can walk on however we have given you a walking boot to wear to help support and protect your ankle.  You should continue to ice it and keep it elevated to help with the swelling and you can take Tylenol and Motrin every 6 hours as needed for pain.  I have given you a prescription of oxycodone to take for breakthrough pain.  This can make you drowsy so do not take before driving, working or operating heavy machinery.  You can follow-up with orthopedics within the next week to have your symptoms rechecked and for further management of your fracture.  You should return to the emergency department for significantly worsening pain, if your toes turn numb or blue or if you have any other new or concerning symptoms.

## 2023-02-20 NOTE — ED Provider Notes (Signed)
[place] EMERGENCY DEPARTMENT AT [facility] Provider Note   CSN: [id number] Arrival date & time: 02/20/23  4696     History  Chief Complaint  Patient presents with   Ankle Pain    [name] is a 42 y.o. female.  Patient is a 42 year old female with no significant past medical history presenting to the emergency department with left ankle pain.  Patient reports 2 nights ago she was getting off a stool and slipped on her left ankle and heard a pop.  She states that she has had pain and swelling in her ankle since then.  She states that she tried to wait to see if the pain would get better but it has not.  She states that she has been able to ambulate on her ankle.  She denies any numbness or weakness.  She states that she has been taking ibuprofen with some relief.  The history is provided by the patient.  Ankle Pain      Home Medications Prior to Admission medications   Medication Sig Start Date End Date Taking? Authorizing Provider  oxyCODONE (ROXICODONE) 5 MG immediate release tablet Take 1 tablet (5 mg total) by mouth every 6 (six) hours as needed. 02/20/23  Yes Elayne Snare K, DO  HYDROcodone-acetaminophen (NORCO/VICODIN) 5-325 MG per tablet Take 1-2 tablets by mouth every 4 (four) hours as needed for moderate pain or severe pain. 09/22/13   Emilia Beck, PA-C  ondansetron (ZOFRAN ODT) 4 MG disintegrating tablet Take 1 tablet (4 mg total) by mouth every 8 (eight) hours as needed for nausea or vomiting. 09/22/13   Emilia Beck, PA-C      Allergies    Patient has no known allergies.    Review of Systems   Review of Systems  Physical Exam Updated Vital Signs BP 139/80   Pulse 83   Temp 98.3 F (36.8 C) (Oral)   Resp 17   Ht 5\' 5"  (1.651 m)   Wt 81.6 kg   LMP 02/20/2023 (Exact Date)   SpO2 100%   BMI 29.95 kg/m  Physical Exam Vitals and nursing note reviewed.  Constitutional:      General: She is not in acute distress.     Appearance: Normal appearance.  HENT:     Head: Normocephalic and atraumatic.     Nose: Nose normal.  Eyes:     Extraocular Movements: Extraocular movements intact.  Cardiovascular:     Rate and Rhythm: Normal rate.     Pulses: Normal pulses.  Pulmonary:     Effort: Pulmonary effort is normal.  Musculoskeletal:     Cervical back: Normal range of motion.     Comments: Tenderness to palpation of anterior and lateral left ankle with swelling around lateral malleolus, no tenderness to left knee or left foot, knee flexion/extension intact, ankle plantar/dorsi flexion limited secondary to pain  Skin:    General: Skin is warm and dry.  Neurological:     General: No focal deficit present.     Mental Status: She is alert and oriented to person, place, and time.  Psychiatric:        Mood and Affect: Mood normal.        Behavior: Behavior normal.     ED Results / Procedures / Treatments   Labs (all labs ordered are listed, but only abnormal results are displayed) Labs Reviewed - No data to display  EKG None  Radiology DG Ankle Complete Left  Result Date: 02/20/2023 CLINICAL DATA:  Injury. Patient reports feeling a tear to her left ankle while attempting to get off of the stool. EXAM: LEFT ANKLE COMPLETE - 3+ VIEW COMPARISON:  None Available. FINDINGS: There is a nondisplaced, obliquely oriented fracture of the distal fibula. There is mild lateral soft tissue swelling. The medial malleolus appears intact. No additional fracture or dislocation. IMPRESSION: Nondisplaced, obliquely oriented fracture of the distal fibula. Electronically Signed   By: Signa Kell M.D.   On: 02/20/2023 07:12    Procedures Procedures    Medications Ordered in ED Medications  acetaminophen (TYLENOL) tablet 650 mg (650 mg Oral Given 02/20/23 0723)    ED Course/ Medical Decision Making/ A&P Clinical Course as of 02/20/23 0745  Thu Feb 20, 2023  0722 Non-displaced distal fibula fracture on xray. Will be  placed in CAM boot and given orthopedics follow up. [VK]    Clinical Course User Index [VK] Rexford Maus, DO                                 Medical Decision Making This patient presents to the ED with chief complaint(s) of left ankle pain with no pertinent past medical history which further complicates the presenting complaint. The complaint involves an extensive differential diagnosis and also carries with it a high risk of complications and morbidity.    The differential diagnosis includes ankle fracture, ankle sprain, no evidence of neurovascular injury  Additional history obtained: Additional history obtained from N/A Records reviewed N/A  ED Course and Reassessment: On patient's arrival she is hemodynamically stable in no acute distress.  Was initially evaluated in triage and had left ankle x-ray performed and was given ice pack.  X-ray read is pending at this time.  She was additionally given Tylenol.  She is neurovascularly intact with no other signs of injury on exam.  Independent labs interpretation:  N/A  Independent visualization of imaging: - I independently visualized the following imaging with scope of interpretation limited to determining acute life threatening conditions related to emergency care: L ankle XR, which revealed non-displaced distal fibula fracture  Consultation: - Consulted or discussed management/test interpretation w/ external professional: N/A  Consideration for admission or further workup: Patient has no emergent conditions requiring admission or further work-up at this time and is stable for discharge home with ortho follow-up  Social Determinants of health: N/A    Amount and/or Complexity of Data Reviewed Radiology: ordered.  Risk OTC drugs. Prescription drug management.          Final Clinical Impression(s) / ED Diagnoses Final diagnoses:  Nondisplaced fracture of lateral malleolus of left fibula, initial encounter for  open fracture type I or II    Rx / DC Orders ED Discharge Orders          Ordered    oxyCODONE (ROXICODONE) 5 MG immediate release tablet  Every 6 hours PRN        02/20/23 0743              Rexford Maus, DO 02/20/23 0745

## 2023-10-01 ENCOUNTER — Other Ambulatory Visit: Payer: Self-pay

## 2023-10-01 ENCOUNTER — Encounter (HOSPITAL_BASED_OUTPATIENT_CLINIC_OR_DEPARTMENT_OTHER): Payer: Self-pay | Admitting: Emergency Medicine

## 2023-10-01 ENCOUNTER — Emergency Department (HOSPITAL_BASED_OUTPATIENT_CLINIC_OR_DEPARTMENT_OTHER)
Admission: EM | Admit: 2023-10-01 | Discharge: 2023-10-01 | Disposition: A | Discharge status: Home / Self Care | Attending: Emergency Medicine | Admitting: Emergency Medicine

## 2023-10-01 DIAGNOSIS — S8991XA Unspecified injury of right lower leg, initial encounter: Secondary | ICD-10-CM | POA: Diagnosis present

## 2023-10-01 DIAGNOSIS — S81011A Laceration without foreign body, right knee, initial encounter: Secondary | ICD-10-CM | POA: Insufficient documentation

## 2023-10-01 DIAGNOSIS — W228XXA Striking against or struck by other objects, initial encounter: Secondary | ICD-10-CM | POA: Diagnosis not present

## 2023-10-01 MED ORDER — CEFADROXIL 500 MG PO CAPS: 500.0000 mg | ORAL_CAPSULE | Freq: Two times a day (BID) | ORAL | 0 refills | Status: AC

## 2023-10-01 MED ORDER — LIDOCAINE-EPINEPHRINE (PF) 2 %-1:200000 IJ SOLN
10.0000 mL | Freq: Once | INTRAMUSCULAR | Status: AC
  Administered 2023-10-01: 10 mL
  Filled 2023-10-01: qty 20

## 2023-10-01 MED ORDER — FLUCONAZOLE 150 MG PO TABS: 150.0000 mg | ORAL_TABLET | Freq: Once | ORAL | 0 refills | Status: AC

## 2023-10-01 NOTE — Discharge Instructions (Signed)
 As discussed, your laceration was repaired using absorbable stitches.  They should dissolve on their own.  Recommend washing area gently with warm soapy water.  Will place you on antibiotics empirically to prevent infection.  Recommend follow-up to primary care for reassessment.  Please do not hesitate to return to the emergency department if the worrisome signs and symptoms we discussed become apparent.

## 2023-10-01 NOTE — ED Triage Notes (Signed)
 Pt caox4, ambulatory, NAD c/o laceration to lateral R knee from corner of coffee table last night at approx 2100. Denies pain, no active bleeding at present.

## 2023-10-01 NOTE — ED Provider Notes (Addendum)
 [place] EMERGENCY DEPARTMENT AT [facility] Provider Note   CSN: [id number] Arrival date & time: 10/01/23  9045     Patient presents with: Laceration   [name] is a 43 y.o. female.    Laceration   43 year old female presents emergency department plaints of laceration.  Laceration present to the right lateral aspect of her knee.  She states she got this when she hit a coffee table accidentally last night.  Placed antibiotic ointment on there and washed it out with warm soapy water.  Presents due to concerns that it may need stitches.  Denies any pain/trauma elsewhere.  Has been ambulating without difficulty.  Past medical history significant for cigarette use  Prior to Admission medications   Medication Sig Start Date End Date Taking? Authorizing Provider  escitalopram (LEXAPRO) 10 MG tablet Take 10 mg by mouth. 08/06/23  Yes [provider]  HYDROcodone -acetaminophen  (NORCO/VICODIN) 5-325 MG per tablet Take 1-2 tablets by mouth every 4 (four) hours as needed for moderate pain or severe pain. 09/22/13   Szekalski, Kaitlyn, PA-C  ondansetron  (ZOFRAN  ODT) 4 MG disintegrating tablet Take 1 tablet (4 mg total) by mouth every 8 (eight) hours as needed for nausea or vomiting. 09/22/13   Szekalski, Kaitlyn, PA-C  oxyCODONE  (ROXICODONE ) 5 MG immediate release tablet Take 1 tablet (5 mg total) by mouth every 6 (six) hours as needed. 02/20/23   Kingsley, Victoria K, DO    Allergies: Patient has no known allergies.    Review of Systems  All other systems reviewed and are negative.   Updated Vital Signs BP 125/83 (BP Location: Left Arm)   Pulse 93   Temp 98.3 F (36.8 C)   Resp 18   Ht 5' 5 (1.651 m)   Wt 79.4 kg   LMP 09/17/2023 (Approximate)   SpO2 100%   BMI 29.12 kg/m   Physical Exam Vitals and nursing note reviewed.  Constitutional:      General: She is not in acute distress.    Appearance: She is well-developed.  HENT:     Head: Normocephalic and  atraumatic.  Eyes:     Conjunctiva/sclera: Conjunctivae normal.  Cardiovascular:     Rate and Rhythm: Normal rate and regular rhythm.     Heart sounds: No murmur heard. Pulmonary:     Effort: Pulmonary effort is normal. No respiratory distress.     Breath sounds: Normal breath sounds.  Abdominal:     Palpations: Abdomen is soft.     Tenderness: There is no abdominal tenderness.  Musculoskeletal:        General: No swelling.     Cervical back: Neck supple.     Comments: No bony tenderness of right knee.  Full range of motion.  Patient able to ambulate without obvious gait abnormality.  Patient with skin avulsion appreciated lateral aspect of right knee measuring 4.2 cm in length.  No surrounding erythema, palpable fluctuance/induration.  No obvious foreign body.  Skin:    General: Skin is warm and dry.     Capillary Refill: Capillary refill takes less than 2 seconds.  Neurological:     Mental Status: She is alert.  Psychiatric:        Mood and Affect: Mood normal.     (all labs ordered are listed, but only abnormal results are displayed) Labs Reviewed - No data to display  EKG: None  Radiology: No results found.   .Laceration Repair  Date/Time: 10/01/2023 10:28 AM  Performed by: Silver Fell  A, PA Authorized by: Silver Wonda LABOR, PA   Consent:    Consent obtained:  Verbal   Consent given by:  Patient   Risks, benefits, and alternatives were discussed: yes     Risks discussed:  Infection, need for additional repair, nerve damage, pain, poor cosmetic result and poor wound healing   Alternatives discussed:  No treatment and delayed treatment Universal protocol:    Procedure explained and questions answered to patient or proxy's satisfaction: yes     Relevant documents present and verified: yes     Patient identity confirmed:  Verbally with patient and arm band Anesthesia:    Anesthesia method:  Local infiltration   Local anesthetic:  Lidocaine  2% WITH  epi Laceration details:    Location:  Leg   Leg location:  R knee   Length (cm):  4.2 Pre-procedure details:    Preparation:  Patient was prepped and draped in usual sterile fashion Exploration:    Limited defect created (wound extended): no     Hemostasis achieved with:  Direct pressure   Imaging outcome: foreign body not noted     Wound exploration: entire depth of wound visualized     Contaminated: no   Treatment:    Area cleansed with:  Saline   Irrigation solution:  Sterile water   Irrigation volume:  500cc   Irrigation method:  Pressure wash   Visualized foreign bodies/material removed: no     Debridement:  None   Undermining:  None   Scar revision: no   Skin repair:    Repair method:  Sutures   Suture size:  4-0   Suture material:  Prolene and fast-absorbing gut   Suture technique:  Simple interrupted and running   Number of sutures:  10 Approximation:    Approximation:  Close Repair type:    Repair type:  Simple Post-procedure details:    Dressing:  Open (no dressing)   Procedure completion:  Tolerated well, no immediate complications    Medications Ordered in the ED  lidocaine -EPINEPHrine (XYLOCAINE  W/EPI) 2 %-1:200000 (PF) injection 10 mL (has no administration in time range)                                    Medical Decision Making Risk Prescription drug management.   This patient presents to the ED for concern of laceration, this involves an extensive number of treatment options, and is a complaint that carries with it a high risk of complications and morbidity.  The differential diagnosis includes fracture, strain/pain, dislocation, ligamentous/tendinous injury, neurovascular compromise, cellulitis, abscess, necrotizing infection, foreign body retainment, other   Co morbidities that complicate the patient evaluation  See HPI   Additional history obtained:  Additional history obtained from EMR External records from outside source obtained and  reviewed including [facility] records   Lab Tests:  N/a   Imaging Studies ordered:  N/a   Cardiac Monitoring: / EKG:  N/a   Consultations Obtained:  N/a   Problem List / ED Course / Critical interventions / Medication management  Laceration I ordered medication including lidocaine  Reevaluation of the patient after these medicines showed that the patient improved I have reviewed the patients home medicines and have made adjustments as needed   Social Determinants of Health:  Chronic cigarette use.  Denies illicit drug use.   Test / Admission - Considered:  Laceration Vitals signs within normal range and stable throughout  visit. 43 year old female presents emergency department plaints of laceration.  Laceration present to the right lateral aspect of her knee.  She states she got this when she hit a coffee table accidentally last night.  Placed antibiotic ointment on there and washed it out with warm soapy water.  Presents due to concerns that it may need stitches.  Denies any pain/trauma elsewhere.  Has been ambulating without difficulty. On exam, skin avulsion appreciated on the right lateral aspect of knee.  No obvious bony tenderness to be suspicious of fracture or dislocation.  Full range of motion of knee without clinical evidence of ligamentous/tendinous injury.  Laceration anesthetized, cleaned and repaired manner as above.  Will empirically patient patient on antibiotics given amount of time since injury occurred as well as concern for underlying health complications complicating wound healing process.  Will recommend wound care at home and follow-up with primary care for reassessment.  Treatment plan discussed with patient and she acknowledged understanding was agreeable to said plan.  Patient will well-appearing, afebrile in no acute distress. Worrisome signs and symptoms were discussed with the patient, and the patient acknowledged understanding to return to the ED if  noticed. Patient was stable upon discharge.       Final diagnoses:  None    ED Discharge Orders     None              Silver Wonda LABOR, [place] 10/01/23 1104    Tegeler, Lonni PARAS, MD 10/01/23 1434
